# Patient Record
Sex: Female | Born: 1965 | Race: White | Hispanic: No | Marital: Single
Health system: Southern US, Community
[De-identification: ages and names within clinical notes are randomized; demographics above are authoritative.]

## PROBLEM LIST (undated history)

## (undated) DIAGNOSIS — F419 Anxiety disorder, unspecified: Secondary | ICD-10-CM

## (undated) DIAGNOSIS — M797 Fibromyalgia: Secondary | ICD-10-CM

## (undated) DIAGNOSIS — F32A Depression, unspecified: Secondary | ICD-10-CM

## (undated) DIAGNOSIS — F329 Major depressive disorder, single episode, unspecified: Secondary | ICD-10-CM

---

## 2017-04-30 ENCOUNTER — Encounter (HOSPITAL_COMMUNITY): Payer: Self-pay | Admitting: Emergency Medicine

## 2017-04-30 ENCOUNTER — Emergency Department (HOSPITAL_COMMUNITY)
Admission: EM | Admit: 2017-04-30 | Discharge: 2017-04-30 | Disposition: A | Discharge status: Home / Self Care | Payer: PRIVATE HEALTH INSURANCE | Attending: Physician Assistant | Admitting: Physician Assistant

## 2017-04-30 ENCOUNTER — Emergency Department (HOSPITAL_COMMUNITY): Payer: PRIVATE HEALTH INSURANCE

## 2017-04-30 DIAGNOSIS — F410 Panic disorder [episodic paroxysmal anxiety] without agoraphobia: Secondary | ICD-10-CM | POA: Diagnosis not present

## 2017-04-30 DIAGNOSIS — R079 Chest pain, unspecified: Secondary | ICD-10-CM | POA: Diagnosis present

## 2017-04-30 HISTORY — DX: Fibromyalgia: M79.7

## 2017-04-30 HISTORY — DX: Major depressive disorder, single episode, unspecified: F32.9

## 2017-04-30 HISTORY — DX: Anxiety disorder, unspecified: F41.9

## 2017-04-30 HISTORY — DX: Depression, unspecified: F32.A

## 2017-04-30 LAB — I-STAT TROPONIN, ED
TROPONIN I, POC: 0.03 ng/mL (ref 0.00–0.08)
TROPONIN I, POC: 0.04 ng/mL (ref 0.00–0.08)

## 2017-04-30 LAB — BASIC METABOLIC PANEL
ANION GAP: 12 (ref 5–15)
BUN: 10 mg/dL (ref 6–20)
CALCIUM: 9.6 mg/dL (ref 8.9–10.3)
CO2: 21 mmol/L — ABNORMAL LOW (ref 22–32)
Chloride: 103 mmol/L (ref 101–111)
Creatinine, Ser: 0.78 mg/dL (ref 0.44–1.00)
GLUCOSE: 128 mg/dL — AB (ref 65–99)
Potassium: 3.7 mmol/L (ref 3.5–5.1)
SODIUM: 136 mmol/L (ref 135–145)

## 2017-04-30 LAB — CBC
HCT: 43.8 % (ref 36.0–46.0)
HEMOGLOBIN: 15.2 g/dL — AB (ref 12.0–15.0)
MCH: 29.5 pg (ref 26.0–34.0)
MCHC: 34.7 g/dL (ref 30.0–36.0)
MCV: 84.9 fL (ref 78.0–100.0)
Platelets: 258 10*3/uL (ref 150–400)
RBC: 5.16 MIL/uL — AB (ref 3.87–5.11)
RDW: 13.4 % (ref 11.5–15.5)
WBC: 6.9 10*3/uL (ref 4.0–10.5)

## 2017-04-30 MED ORDER — LORAZEPAM 1 MG PO TABS
0.5000 mg | ORAL_TABLET | Freq: Once | ORAL | Status: AC
  Administered 2017-04-30: 0.5 mg via ORAL
  Filled 2017-04-30: qty 1

## 2017-04-30 MED ORDER — ONDANSETRON HCL 4 MG/2ML IJ SOLN
4.0000 mg | Freq: Once | INTRAMUSCULAR | Status: AC
  Administered 2017-04-30: 4 mg via INTRAVENOUS
  Filled 2017-04-30: qty 2

## 2017-04-30 MED ORDER — LORAZEPAM 1 MG PO TABS: 0.5000 mg | ORAL_TABLET | Freq: Three times a day (TID) | ORAL | 0 refills | Status: AC | PRN

## 2017-04-30 NOTE — ED Notes (Signed)
Patient verbalized understanding of discharge instructions and denies any further needs or questions at this time. VS stable. Patient ambulatory with steady gait.  

## 2017-04-30 NOTE — Discharge Instructions (Signed)
Please follow-up with her primary care. It may be best if you were restarted on some other antianxiety medications. We did not see any evidence of anything wrong with your heart today, please follow up with your primary care physician for further care.

## 2017-04-30 NOTE — ED Provider Notes (Signed)
[facility]-EMERGENCY DEPT Provider Note   CSN: [id number] Arrival date & time: 04/30/17  4098     History   Chief Complaint Chief Complaint  Patient presents with  . Chest Pain    HPI Paula Weaver is a 51 y.o. female.  HPI  51 year old female past medical history significant for anxiety depression of hypertension presenting today with anxiety attack and chest pain. Patient reports that she's having intermittent chest pain on her way to work today. She reports it got worse and that she had numbness tingling in her bilateral hands. She had anxiety, panic, palpitations, chest pain. She felt she couldn't move any of her limbs.  Most of the symptoms are now resolving. Patient reports that she was on 5 or 6 medications for anxiety, fibromyalgia  and depression but took herself off all of them last month.  No risk factors for pulmonary embolism. No hypertension hyperlipidemia and no diabetes.   Past Medical History:  Diagnosis Date  . Anxiety   . Depression   . Fibromyalgia     There are no active problems to display for this patient.   No past surgical history on file.  OB History    No data available       Home Medications    Prior to Admission medications   Not on File    Family History No family history on file.  Social History Social History  Substance Use Topics  . Smoking status: Not on file  . Smokeless tobacco: Not on file  . Alcohol use Not on file     Allergies   Erythromycin   Review of Systems Review of Systems  Constitutional: Negative for activity change, fatigue and fever.  Respiratory: Positive for chest tightness and shortness of breath.   Cardiovascular: Positive for chest pain.  Gastrointestinal: Positive for nausea. Negative for abdominal pain and diarrhea.  Psychiatric/Behavioral: The patient is nervous/anxious.      Physical Exam Updated Vital Signs BP (!) 148/99   Pulse 92   Temp 98.4 F (36.9 C) (Oral)   Resp 16   Ht 5'  8" (1.727 m)   Wt 65.8 kg (145 lb)   SpO2 100%   BMI 22.05 kg/m   Physical Exam  Constitutional: She is oriented to person, place, and time. She appears well-developed and well-nourished.  HENT:  Head: Normocephalic and atraumatic.  Eyes: Right eye exhibits no discharge.  Cardiovascular: Normal rate, regular rhythm and normal heart sounds.   No murmur heard. Pulmonary/Chest: Effort normal and breath sounds normal. She has no wheezes. She has no rales.  Abdominal: Soft. She exhibits no distension. There is no tenderness.  Neurological: She is oriented to person, place, and time.  Skin: Skin is warm and dry. She is not diaphoretic.  Psychiatric: She has a normal mood and affect.  Very anxious  Nursing note and vitals reviewed.    ED Treatments / Results  Labs (all labs ordered are listed, but only abnormal results are displayed) Labs Reviewed  BASIC METABOLIC PANEL  CBC  I-STAT TROPONIN, ED    EKG  EKG Interpretation  Date/Time:  Monday April 30 2017 09:48:44 EDT Ventricular Rate:  77 PR Interval:    QRS Duration: 87 QT Interval:  375 QTC Calculation: 425 R Axis:   61 Text Interpretation:  Sinus rhythm Normal sinus rhythm Confirmed by Ireene Ballowe ([id number]) on 04/30/2017 10:09:09 AM       Radiology No results found.  Procedures Procedures (including critical care time)  Medications Ordered in ED Medications  LORazepam (ATIVAN) tablet 0.5 mg (not administered)     Initial Impression / Assessment and Plan / ED Course  I have reviewed the triage vital signs and the nursing notes.  Pertinent labs & imaging results that were available during my care of the patient were reviewed by me and considered in my medical decision making (see chart for details).    51 year old female past medical history significant for anxiety depression of hypertension presenting today with anxiety attack and chest pain. Patient reports that she's having intermittent chest pain  on her way to work today. She reports it got worse and that she had numbness tingling in her bilateral hands. She had anxiety, panic, palpitations, chest pain. She felt she couldn't move any of her limbs.  Most of the symptoms are now resolving. Patient reports that she was on 5 or 6 medications for anxiety, fibromyalgia  and depression but took herself off all of them last month.  No risk factors for pulmonary embolism. No hypertension hyperlipidemia and no diabetes.  10:11 AM This represents a anxiety attack. However given patient's age, will do delta troponin. She has low heart score. Anticipate negative and be ability to discharge home.   Delta trop negative.  Will have her follow up with PCP, Will giver short script for ativan to help with anxiety until follow up.    Final Clinical Impressions(s) / ED Diagnoses   Final diagnoses:  None    New Prescriptions New Prescriptions   No medications on file     Abelino Derrick, MD 04/30/17 1604

## 2017-04-30 NOTE — ED Triage Notes (Signed)
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 PT was reporting chest pain and hyperventilating when EMS arrived. They coached her breathing and hyperventilation improved. PT reports environmental stress lately. PT used to take meds for anxiety, depression, and fibromyalgia, but stopped all meds 6 months ago. PT reports pain is intermittent, occurs in center chest with radiation down both arms. PT endorses SOB, nausea, and lightheadedness when pain occurs.

## 2017-04-30 NOTE — ED Notes (Signed)
Patient ambulatory to restroom and back with steady gait. Denies CP/SOB at this time. Pt c/o nausea - MD aware.

## 2017-04-30 NOTE — ED Notes (Signed)
Patient transported to X-ray 

## 2019-05-31 IMAGING — DX DG CHEST 2V
2 series · 2 of 2 positions shown · non-contrast
Comparison: None.

CLINICAL DATA: Chest pain and dizziness

EXAM:
CHEST  2 VIEW

[w chest pa]
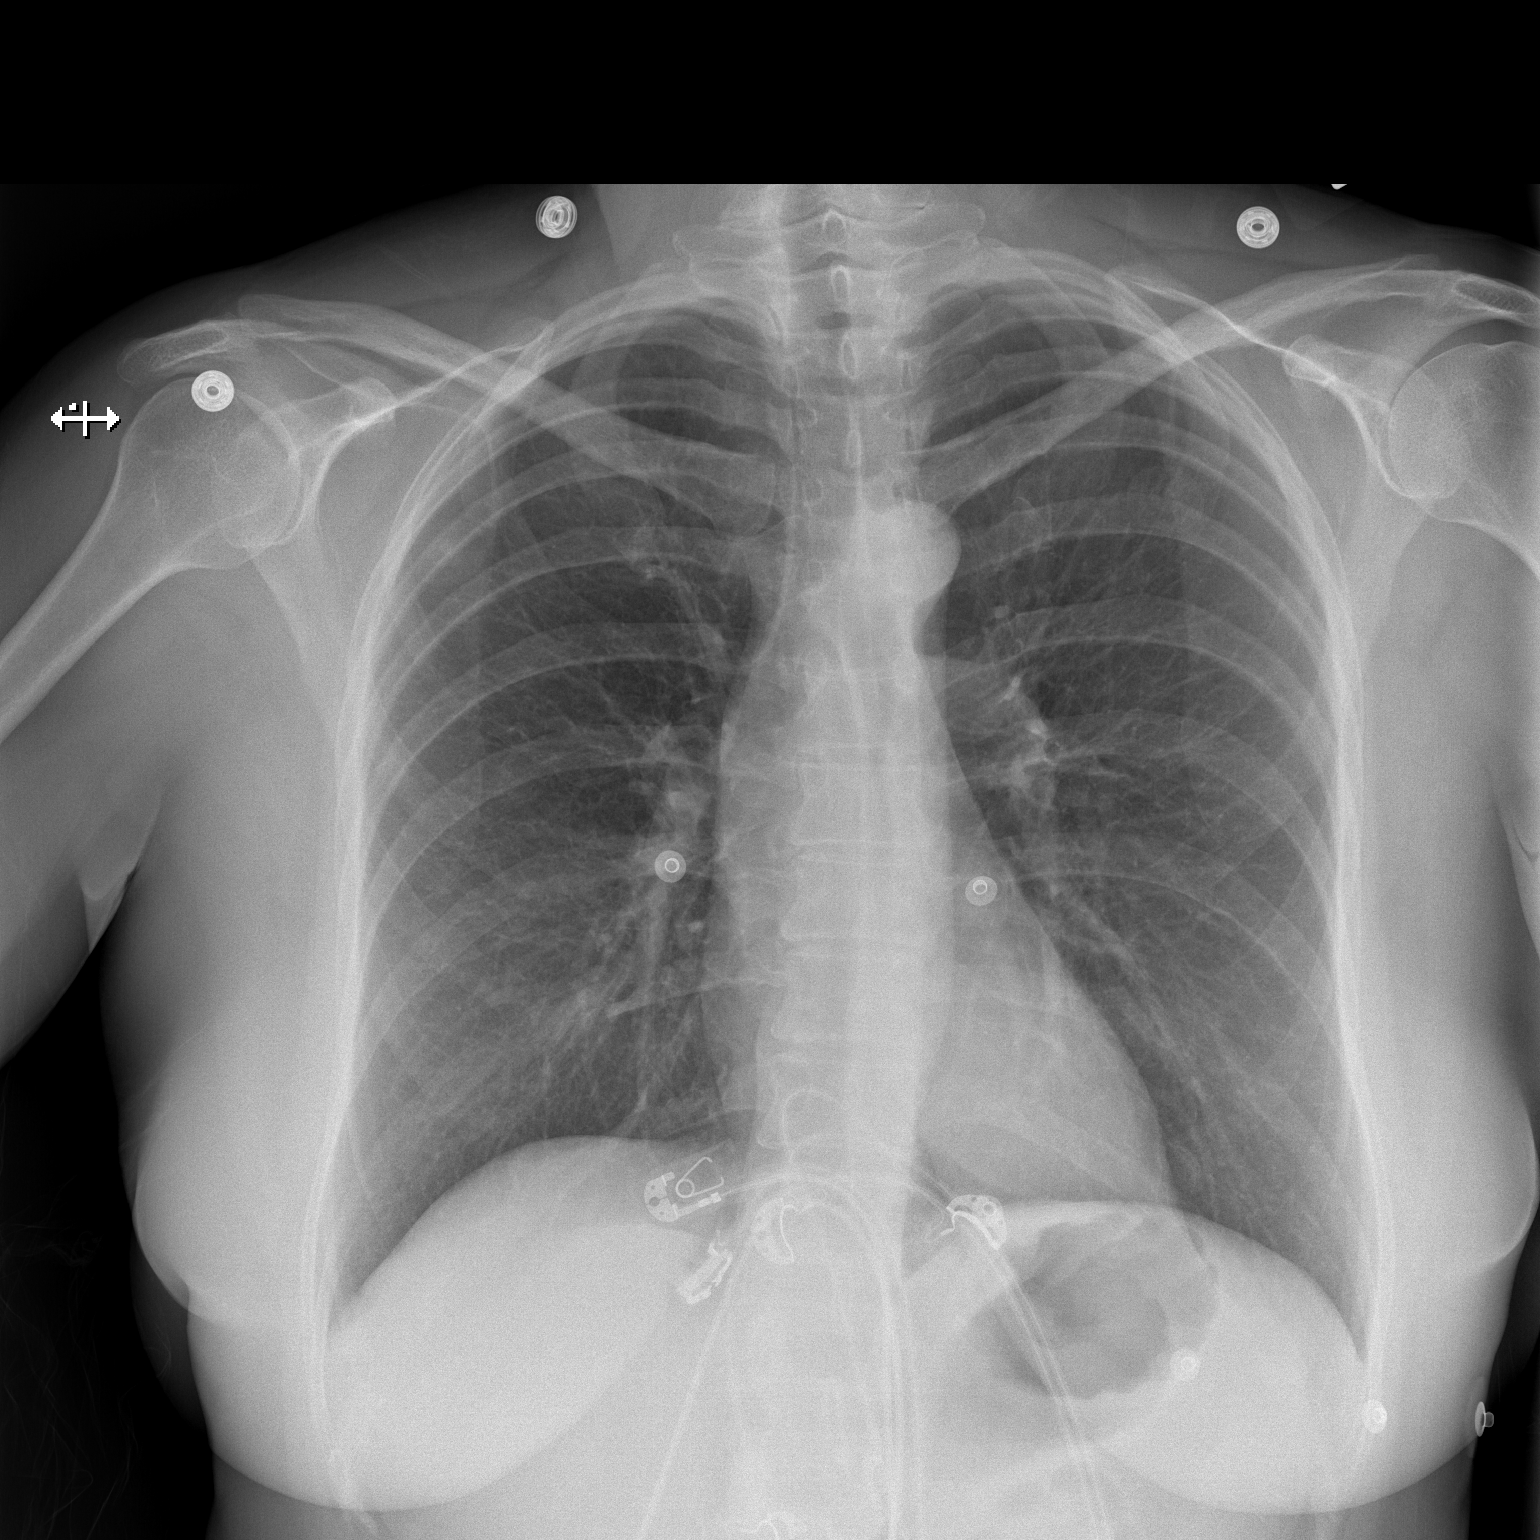

[w chest lat]
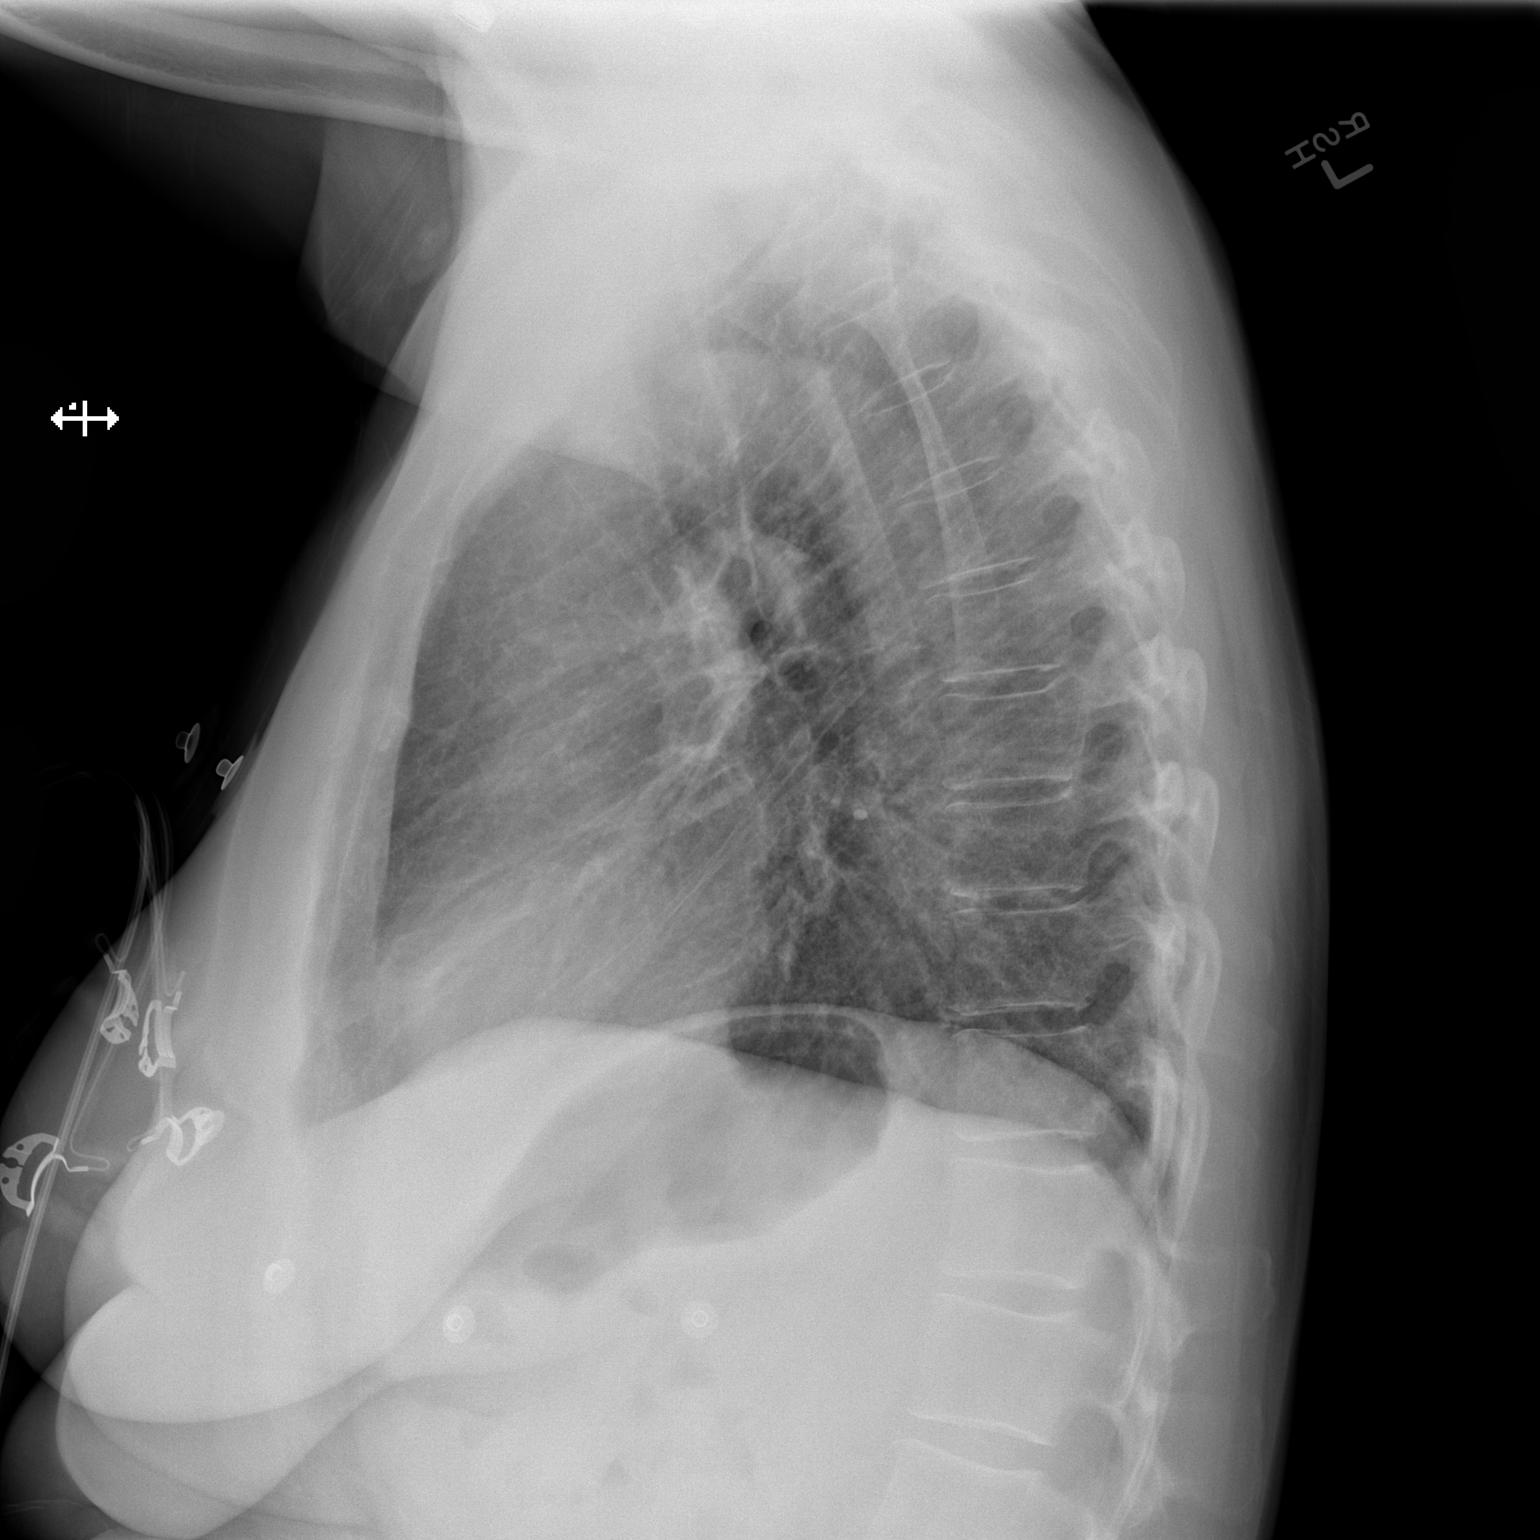

[2 of 2 positions shown; findings below may reference images not displayed]

FINDINGS: Overlying monitor leads are present. There is no edema or
consolidation. Heart size and pulmonary vascularity are normal. No
adenopathy. No bone lesions.
IMPRESSION: No edema or consolidation.
